# Patient Record
Sex: Male | Born: 1960 | Race: White | Hispanic: No | Marital: Single | Smoking: Former smoker
Health system: Southern US, Community
[De-identification: ages and names within clinical notes are randomized; demographics above are authoritative.]

## PROBLEM LIST (undated history)

## (undated) ENCOUNTER — Emergency Department (HOSPITAL_COMMUNITY): Admission: EM | Payer: Self-pay

## (undated) DIAGNOSIS — F32A Depression, unspecified: Secondary | ICD-10-CM

## (undated) DIAGNOSIS — I10 Essential (primary) hypertension: Secondary | ICD-10-CM

## (undated) DIAGNOSIS — M1990 Unspecified osteoarthritis, unspecified site: Secondary | ICD-10-CM

## (undated) DIAGNOSIS — E7800 Pure hypercholesterolemia, unspecified: Secondary | ICD-10-CM

## (undated) DIAGNOSIS — F329 Major depressive disorder, single episode, unspecified: Secondary | ICD-10-CM

## (undated) HISTORY — DX: Major depressive disorder, single episode, unspecified: F32.9

## (undated) HISTORY — DX: Unspecified osteoarthritis, unspecified site: M19.90

## (undated) HISTORY — DX: Depression, unspecified: F32.A

---

## 2004-08-09 ENCOUNTER — Ambulatory Visit: Payer: Self-pay | Admitting: Internal Medicine

## 2010-10-03 ENCOUNTER — Encounter: Payer: Self-pay | Admitting: Internal Medicine

## 2015-05-27 ENCOUNTER — Ambulatory Visit (INDEPENDENT_AMBULATORY_CARE_PROVIDER_SITE_OTHER): Payer: Worker's Compensation | Admitting: Emergency Medicine

## 2015-05-27 VITALS — BP 138/74 | HR 80 | Temp 98.1°F | Resp 18 | Ht 69.0 in

## 2015-05-27 DIAGNOSIS — Z23 Encounter for immunization: Secondary | ICD-10-CM

## 2015-05-27 DIAGNOSIS — S81811A Laceration without foreign body, right lower leg, initial encounter: Secondary | ICD-10-CM

## 2015-05-27 NOTE — Patient Instructions (Signed)
Please keep dressing on for next 24 hours.  Then remove dressing and clean twice per day with soap and water.   Please return sooner if you are having complications stated below.   Laceration Care, Adult A laceration is a cut or lesion that goes through all layers of the skin and into the tissue just beneath the skin. TREATMENT  Some lacerations may not require closure. Some lacerations may not be able to be closed due to an increased risk of infection. It is important to see your caregiver as soon as possible after an injury to minimize the risk of infection and maximize the opportunity for successful closure. If closure is appropriate, pain medicines may be given, if needed. The wound will be cleaned to help prevent infection. Your caregiver will use stitches (sutures), staples, wound glue (adhesive), or skin adhesive strips to repair the laceration. These tools bring the skin edges together to allow for faster healing and a better cosmetic outcome. However, all wounds will heal with a scar. Once the wound has healed, scarring can be minimized by covering the wound with sunscreen during the day for 1 full year. HOME CARE INSTRUCTIONS  For sutures or staples:  Keep the wound clean and dry.  If you were given a bandage (dressing), you should change it at least once a day. Also, change the dressing if it becomes wet or dirty, or as directed by your caregiver.  Wash the wound with soap and water 2 times a day. Rinse the wound off with water to remove all soap. Pat the wound dry with a clean towel.  After cleaning, apply a thin layer of the antibiotic ointment as recommended by your caregiver. This will help prevent infection and keep the dressing from sticking.  You may shower as usual after the first 24 hours. Do not soak the wound in water until the sutures are removed.  Only take over-the-counter or prescription medicines for pain, discomfort, or fever as directed by your caregiver.  Get your  sutures or staples removed as directed by your caregiver. For skin adhesive strips:  Keep the wound clean and dry.  Do not get the skin adhesive strips wet. You may bathe carefully, using caution to keep the wound dry.  If the wound gets wet, pat it dry with a clean towel.  Skin adhesive strips will fall off on their own. You may trim the strips as the wound heals. Do not remove skin adhesive strips that are still stuck to the wound. They will fall off in time. For wound adhesive:  You may briefly wet your wound in the shower or bath. Do not soak or scrub the wound. Do not swim. Avoid periods of heavy perspiration until the skin adhesive has fallen off on its own. After showering or bathing, gently pat the wound dry with a clean towel.  Do not apply liquid medicine, cream medicine, or ointment medicine to your wound while the skin adhesive is in place. This may loosen the film before your wound is healed.  If a dressing is placed over the wound, be careful not to apply tape directly over the skin adhesive. This may cause the adhesive to be pulled off before the wound is healed.  Avoid prolonged exposure to sunlight or tanning lamps while the skin adhesive is in place. Exposure to ultraviolet light in the first year will darken the scar.  The skin adhesive will usually remain in place for 5 to 10 days, then naturally fall  off the skin. Do not pick at the adhesive film. You may need a tetanus shot if:  You cannot remember when you had your last tetanus shot.  You have never had a tetanus shot. If you get a tetanus shot, your arm may swell, get red, and feel warm to the touch. This is common and not a problem. If you need a tetanus shot and you choose not to have one, there is a rare chance of getting tetanus. Sickness from tetanus can be serious. SEEK MEDICAL CARE IF:   You have redness, swelling, or increasing pain in the wound.  You see a red line that goes away from the wound.  You  have yellowish-white fluid (pus) coming from the wound.  You have a fever.  You notice a bad smell coming from the wound or dressing.  Your wound breaks open before or after sutures have been removed.  You notice something coming out of the wound such as wood or glass.  Your wound is on your hand or foot and you cannot move a finger or toe. SEEK IMMEDIATE MEDICAL CARE IF:   Your pain is not controlled with prescribed medicine.  You have severe swelling around the wound causing pain and numbness or a change in color in your arm, hand, leg, or foot.  Your wound splits open and starts bleeding.  You have worsening numbness, weakness, or loss of function of any joint around or beyond the wound.  You develop painful lumps near the wound or on the skin anywhere on your body. MAKE SURE YOU:   Understand these instructions.  Will watch your condition.  Will get help right away if you are not doing well or get worse. Document Released: 08/29/2005 Document Revised: 11/21/2011 Document Reviewed: 02/22/2011 [facility] Patient Information 2015 [place], [place]. This information is not intended to replace advice given to you by your health care provider. Make sure you discuss any questions you have with your health care provider.

## 2015-05-27 NOTE — Progress Notes (Deleted)
[facility] [address], [place] [address] [phone]  Date:  05/27/2015   Name:  Victor Olson   DOB:  1961/03/24   MRN:  [id number]  PCP:  No PCP Per Patient    History of Present Illness:  Victor Olson is a 54 y.o. male patient who present to [facility]     There are no active problems to display for this patient.   History reviewed. No pertinent past medical history.  History reviewed. No pertinent past surgical history.  Social History  Substance Use Topics  . Smoking status: Current Every Day Smoker  . Smokeless tobacco: None  . Alcohol Use: No    Family History  Problem Relation Age of Onset  . Heart disease Father     No Known Allergies  Medication list has been reviewed and updated.  No current outpatient prescriptions on file prior to visit.   No current facility-administered medications on file prior to visit.    ROS   Physical Examination: BP 138/74 mmHg  Pulse 80  Temp(Src) 98.1 F (36.7 C) (Oral)  Resp 18  Ht  (1.753 m)  SpO2 98% Ideal Body Weight: Weight in (lb) to have BMI = 25: 168.9  Physical Exam   Assessment and Plan:  1. Need for prophylactic vaccination with combined diphtheria-tetanus-pertussis (DTP) vaccine *** - Tdap vaccine greater than or equal to 7yo IM   Trena Platt, PA-C [facility] 05/27/2015 11:42 AM

## 2015-05-29 NOTE — Progress Notes (Signed)
Victor Olson 05-Mar-1961 54 y.o.   Chief Complaint  Patient presents with  . Laceration    Right knee today     Date of Injury: 05/27/2015  History of Present Illness:  Presents for evaluation of work-related complaint.  Victor Olson is a 54 y.o. male patient who presents to [facility] for chief complaint of a right leg laceration.  He was attempting to cut a conduit when the conduit slipped and the cutting knife sliced his right shin just below the knee. He has no numbness or tingling. He is able to apply weight to the leg.  Bleeding immediately ensued. He did not clean the wound but instead packed it with Vaseline. The bleeding has continued.  He needs a TDAP at this time.    ROS ROS unremarkable unless listed above.   No Known Allergies   Current medications reviewed and updated. Past medical history, family history, social history have been reviewed and updated.   Physical Exam  Constitutional: He is well-developed, well-nourished, and in no distress. No distress.  HENT:  Head: Normocephalic and atraumatic.  Musculoskeletal:       Right knee: He exhibits normal range of motion and no swelling.  Skin: Skin is warm and dry. He is not diaphoretic.  Right leg with 3 cm horizontal laceration just inferior to knee. Laceration does not extend through musculature. No foreign bodies seen. Appears to extend through the adipose tissue.   Procedure: Verbal consent obtained.  1% Lidocaine with epi inserted into wound site.  Anesthesia obtained.  Wound cleansed with soap and water.  5-0 ethilon used for horizontal mattress suture.    Assessment and Plan: 54 year old male is here today for chief complaint right leg laceration. Laceration sutured with little complication.  Advised wound care protocol.  Alarming sxs discussed. He will rtc in 9 days for suture removal.   Leg laceration, right, initial encounter - Plan: Tdap vaccine greater than or equal to 7yo IM  Need for prophylactic  vaccination with combined diphtheria-tetanus-pertussis (DTP) vaccine - Plan: Tdap vaccine greater than or equal to 7yo IM  Trena Platt, PA-C [facility] 9/16/20165:12 PM

## 2015-05-30 NOTE — Progress Notes (Signed)
  Medical screening examination/treatment/procedure(s) were performed by non-physician practitioner and as supervising physician I was immediately available for consultation/collaboration.     

## 2016-12-22 ENCOUNTER — Ambulatory Visit (INDEPENDENT_AMBULATORY_CARE_PROVIDER_SITE_OTHER): Payer: Self-pay

## 2016-12-22 ENCOUNTER — Ambulatory Visit (INDEPENDENT_AMBULATORY_CARE_PROVIDER_SITE_OTHER): Payer: Self-pay | Admitting: Family Medicine

## 2016-12-22 VITALS — BP 140/84 | HR 88 | Temp 97.9°F | Resp 18 | Ht 69.0 in | Wt 171.0 lb

## 2016-12-22 DIAGNOSIS — F1123 Opioid dependence with withdrawal: Secondary | ICD-10-CM

## 2016-12-22 DIAGNOSIS — G8929 Other chronic pain: Secondary | ICD-10-CM

## 2016-12-22 DIAGNOSIS — M545 Low back pain, unspecified: Secondary | ICD-10-CM

## 2016-12-22 DIAGNOSIS — F1193 Opioid use, unspecified with withdrawal: Secondary | ICD-10-CM

## 2016-12-22 DIAGNOSIS — R399 Unspecified symptoms and signs involving the genitourinary system: Secondary | ICD-10-CM

## 2016-12-22 LAB — POCT CBC
GRANULOCYTE PERCENT: 64.2 % (ref 37–80)
HEMATOCRIT: 48.4 % (ref 43.5–53.7)
Hemoglobin: 17 g/dL (ref 14.1–18.1)
LYMPH, POC: 3.5 — AB (ref 0.6–3.4)
MCH, POC: 31 pg (ref 27–31.2)
MCHC: 35.2 g/dL (ref 31.8–35.4)
MCV: 88.2 fL (ref 80–97)
MID (cbc): 0.5 (ref 0–0.9)
MPV: 8.7 fL (ref 0–99.8)
POC GRANULOCYTE: 7.3 — AB (ref 2–6.9)
POC LYMPH PERCENT: 31 %L (ref 10–50)
POC MID %: 4.8 %M (ref 0–12)
Platelet Count, POC: 342 10*3/uL (ref 142–424)
RBC: 5.48 M/uL (ref 4.69–6.13)
RDW, POC: 12.8 %
WBC: 11.3 10*3/uL — AB (ref 4.6–10.2)

## 2016-12-22 LAB — GLUCOSE, POCT (MANUAL RESULT ENTRY): POC GLUCOSE: 112 mg/dL — AB (ref 70–99)

## 2016-12-22 LAB — POCT URINALYSIS DIP (MANUAL ENTRY)
Bilirubin, UA: NEGATIVE
Glucose, UA: NEGATIVE mg/dL
Ketones, POC UA: NEGATIVE mg/dL
Leukocytes, UA: NEGATIVE
NITRITE UA: NEGATIVE
PH UA: 6 (ref 5.0–8.0)
PROTEIN UA: NEGATIVE mg/dL
RBC UA: NEGATIVE
SPEC GRAV UA: 1.02 (ref 1.010–1.025)
UROBILINOGEN UA: 0.2 U/dL

## 2016-12-22 MED ORDER — TRAMADOL HCL 50 MG PO TABS: 50.0000 mg | ORAL_TABLET | Freq: Three times a day (TID) | ORAL | 1 refills | Status: AC | PRN

## 2016-12-22 MED ORDER — KETOROLAC TROMETHAMINE 60 MG/2ML IM SOLN
60.0000 mg | Freq: Once | INTRAMUSCULAR | Status: AC
  Administered 2016-12-22: 60 mg via INTRAMUSCULAR

## 2016-12-22 MED ORDER — CYCLOBENZAPRINE HCL 10 MG PO TABS: 10.0000 mg | ORAL_TABLET | Freq: Every evening | ORAL | 1 refills | Status: DC | PRN

## 2016-12-22 NOTE — Progress Notes (Signed)
Victor Olson is a 56 y.o. male who presents to Primary Care at [facility] today for back pain:    1.  Back pain:  Present for the past month. Describes dull aching pain alternating with sharp stabbing pain that radiates from his neck, mid back, lumbar region. Does not radiate to buttocks. Did not radiate to legs. The specimen bases and some paresthesias described as burning sensation bilateral bottoms of his feet. Otherwise no paresthesias, numbness, weakness bilateral lower extremities. It is not worse in the right versus left side. Pain is 5 out of 10 at its worst is 10. He is not tried any ice or heat for relief. He has been trying this could just rest.  He works as an Personnel officer. It is worse but is trying to bend over because of work. No injuries to his back. He states he was told he was 56 years old 55" worst back arthritis of any 59 year old" the doctor did seem. He took some of his friends "pain pills" starting about 4 weeks ago and took this for about 2 weeks and then friend would not give him anymore.  No LE weakness or changes in gait.  No motor weakness/decreased sensation BL LE's.  No fevers or chills.  No dysuria, hematuria, urinary frequency, incontinence of bladder or bowel.    He has had about 10 years of increasing lower urinary tract symptoms which includes urgency, hesitancy. For the past 2 weeks has been having nocturia 3-4 times a night.  He's also had several episodes of diarrhea the past 2 weeks. With some "upset stomach." No vomiting. No nausea. Diarrhea has been watery. Nonbloody. No melena.   ROS as above.     PMH reviewed. Patient is a nonsmoker.   Past Medical History:  Diagnosis Date  . Arthritis   . Depression    No past surgical history on file.  Medications reviewed. Current Outpatient Prescriptions  Medication Sig Dispense Refill  . Oxycodone-Acetaminophen (PERCOCET PO) Take by mouth.     No current facility-administered medications for this visit.       Physical Exam:  BP (!) 160/77   Pulse 88   Temp 97.9 F (36.6 C) (Oral)   Resp 18   Ht  (1.753 m)   Wt 171 lb (77.6 kg)   SpO2 97%   BMI 25.25 kg/m  Gen:  Alert, cooperative patient who appears stated age in no acute distress.  Vital signs reviewed. HEENT: EOMI,  MMM Pulm:  Clear to auscultation bilaterally with good air movement.  No wheezes or rales noted.  Cardiac:  Regular rate and rhythm without murmur auscultated.  Good S1/S2. Abd:  Soft/nondistended/nontender.   Ext:  No LE edema noted Back:  Normal skin, Spine with normal alignment and no deformity.  No tenderness to vertebral process palpation.  Paraspinous muscles are tender and with spasm most notably mid-thoracic and lower lumbar spine.   Range of motion is full at neck and decreased flexion in lumbar sacral regions.  Straight leg raise is positive BL Neuro:  Sensation and motor function 5/5 bilateral lower extremities.  Patellar and Achilles  DTR's +2 patellar BL.  He is able to walk on his heels and toes without difficulty.  Psych:  Appears mildly anxious.  Skin:  Nontender nodules, rubber, consistent with lipoma tricep region of Left arm and 1 on chest.   Lymph:  No axillary lymph nodules.   Assessment and Plan:  1.  Muscle spasm: - secondary to  work and ongoing aggravation.   - Negative radiographs here.   - Treating with ibuprofen, tramadol severe pain, muscle relaxer - ice and home physical therapy  2.  Opiate withdrawal: - patient's sweatiness, diarrhea, other symptoms most consistent with opiate w/d -- which makes sense in light of his friend providing him with inconsistent opiates.  - checking urine test for drugs today.  - no evidence of other systemic issues.  We are checking blood count, etc today  3.  Urinary urgency: - likely BPH symptoms - checking PSA today.

## 2016-12-22 NOTE — Patient Instructions (Addendum)
We checked multiple things today.  Your x-rays showed you do have some arthritis but it's not terrible.    You did not have any signs of diabetes which is good news.  Your urine test was also negative.  This is also good news.   We are checking some blood levels which includes your prostate.  This may simply be enlarged, which can cause many of the symptoms you've been having.   Finally, you are having some withdrawal symptoms from the Percocet.  This will be gone in the next several days.    You have a bad muscle spasm in your back.  Take the Ibuprofen 400 mg every 8 hours or so for pain relief and anti-inflammatory effects.  Take Tramadol for severe pain.  Take the Flexeril as a muscle relaxer at night. This may make you drowsy and if it does do not take it during the day.  Heat and massage are also great to help relieve the pain.   IF you received an x-ray today, you will receive an invoice from [facility]. Please contact [facility] at [phone] with questions or concerns regarding your invoice.   IF you received labwork today, you will receive an invoice from [place]. Please contact [facility] at [phone] with questions or concerns regarding your invoice.   Our billing staff will not be able to assist you with questions regarding bills from these companies.  You will be contacted with the lab results as soon as they are available. The fastest way to get your results is to activate your My Chart account. Instructions are located on the last page of this paperwork. If you have not heard from [place] regarding the results in 2 weeks, please contact this office.

## 2016-12-23 LAB — COMPREHENSIVE METABOLIC PANEL
A/G RATIO: 1.4 (ref 1.2–2.2)
ALT: 15 IU/L (ref 0–44)
AST: 14 IU/L (ref 0–40)
Albumin: 4.6 g/dL (ref 3.5–5.5)
Alkaline Phosphatase: 58 IU/L (ref 39–117)
BILIRUBIN TOTAL: 0.3 mg/dL (ref 0.0–1.2)
BUN/Creatinine Ratio: 11 (ref 9–20)
BUN: 13 mg/dL (ref 6–24)
CALCIUM: 10.2 mg/dL (ref 8.7–10.2)
CHLORIDE: 98 mmol/L (ref 96–106)
CO2: 23 mmol/L (ref 18–29)
Creatinine, Ser: 1.17 mg/dL (ref 0.76–1.27)
GFR calc non Af Amer: 71 mL/min/{1.73_m2} (ref >59)
GFR, EST AFRICAN AMERICAN: 82 mL/min/{1.73_m2} (ref >59)
GLUCOSE: 119 mg/dL — AB (ref 65–99)
Globulin, Total: 3.4 g/dL (ref 1.5–4.5)
POTASSIUM: 4.3 mmol/L (ref 3.5–5.2)
Sodium: 142 mmol/L (ref 134–144)
TOTAL PROTEIN: 8 g/dL (ref 6.0–8.5)

## 2016-12-23 LAB — PMP SCREEN PROFILE (10S), URINE
AMPHETAMINE SCRN UR: NEGATIVE ng/mL
Barbiturate Screen, Ur: NEGATIVE ng/mL
Benzodiazepine Screen, Urine: NEGATIVE ng/mL
COCAINE(METAB.) SCREEN, URINE: NEGATIVE ng/mL
CREATININE(CRT), U: 135.5 mg/dL (ref 20.0–300.0)
Cannabinoids Ur Ql Scn: NEGATIVE ng/mL
METHADONE SCREEN, URINE: NEGATIVE ng/mL
OPIATE SCRN UR: NEGATIVE ng/mL
Oxycodone+Oxymorphone Ur Ql Scn: NEGATIVE ng/mL
PCP SCRN UR: NEGATIVE ng/mL
PROPOXYPHENE SCREEN: NEGATIVE ng/mL
Ph of Urine: 5.6 (ref 4.5–8.9)

## 2016-12-23 LAB — PSA: PROSTATE SPECIFIC AG, SERUM: 2.6 ng/mL (ref 0.0–4.0)

## 2016-12-23 LAB — TSH: TSH: 1.58 u[IU]/mL (ref 0.450–4.500)

## 2017-01-16 ENCOUNTER — Telehealth: Payer: Self-pay

## 2017-01-16 NOTE — Telephone Encounter (Signed)
Looks like written rx given for tramadol?  See labs and advise

## 2017-01-16 NOTE — Telephone Encounter (Signed)
PATIENT SAW DR. Gwendolyn GrantWALDEN ON April 12,2018 FOR A FEW DIFFERENT THINGS. HE SAID DR. Gwendolyn GrantWALDEN DID SOME LAB WORK ON HIM BUT HE HAS NEVER GOTTEN THE RESULTS. HE ALSO SAID DR. Gwendolyn GrantWALDEN WAS GOING TO CALL IN SOME MEDICINE FOR HIS FOOT PAIN AND HE HAS NEVER GOTTEN THAT EITHER. BEST PHONE [phone] (CELL) PHARMACY CHOICE IS [facility] ON [address]. MBC

## 2017-01-17 NOTE — Telephone Encounter (Signed)
Labs all looked good.  The patient had a refill for his Tramadol on the printed prescription he received last OV.  I checked the [place] database, and he has not refilled this, so he should be able to go back to his pharmacy to get his medicines refilled.    Please call and let patient know about his normal labs and the fact he has a refill on his Tramadol.  This should help with his foot pain -- if not, he should make an appt to be seen again.  Thanks!  JW

## 2017-01-17 NOTE — Telephone Encounter (Signed)
Pt states he is using tramadol? but it doesn't help, now is asking for stronger pain med. He was advised of nl labs

## 2017-01-19 NOTE — Telephone Encounter (Signed)
If he needs a medicine stronger than Tramadol, he will need to be seen for this.  In the meantime, he should continue with the tramadol for pain relief.

## 2017-01-20 NOTE — Telephone Encounter (Signed)
Pt advised, tx up front for appt. 

## 2017-01-25 ENCOUNTER — Ambulatory Visit (INDEPENDENT_AMBULATORY_CARE_PROVIDER_SITE_OTHER): Payer: Self-pay | Admitting: Family Medicine

## 2017-01-25 ENCOUNTER — Encounter: Payer: Self-pay | Admitting: Family Medicine

## 2017-01-25 VITALS — BP 146/90 | HR 120 | Temp 98.3°F | Resp 17 | Ht 69.0 in | Wt 175.0 lb

## 2017-01-25 DIAGNOSIS — M5442 Lumbago with sciatica, left side: Secondary | ICD-10-CM

## 2017-01-25 MED ORDER — GABAPENTIN 300 MG PO CAPS: 300.0000 mg | ORAL_CAPSULE | Freq: Every day | ORAL | 3 refills | Status: AC

## 2017-01-25 MED ORDER — HYDROCODONE-ACETAMINOPHEN 5-325 MG PO TABS: 1.0000 | ORAL_TABLET | Freq: Four times a day (QID) | ORAL | 0 refills | Status: DC | PRN

## 2017-01-25 MED ORDER — CYCLOBENZAPRINE HCL 10 MG PO TABS: 10.0000 mg | ORAL_TABLET | Freq: Every evening | ORAL | 1 refills | Status: AC | PRN

## 2017-01-25 MED ORDER — HYDROCODONE-ACETAMINOPHEN 10-325 MG PO TABS: 1.0000 | ORAL_TABLET | ORAL | 0 refills | Status: DC | PRN
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
## 2017-01-25 NOTE — Patient Instructions (Addendum)
  It was good to see you today.    I'm sorry you're hurting so bad!  Take the Norco when you're having bad pain.  Take the Gabapentin at night to help with the tingling and burning.  You can take 1/2 to 1 pill of Flexeril as needed for a muscle relaxer every 8 hours or so.  We'll get you set up for an MRI of your back.    If you start having worsening pain despite the medicine, any weakness, or any incontinence, don't wait and come back immediately.    Call [phone] for [facility] Financial Assistance.     IF you received an x-ray today, you will receive an invoice from [facility]. Please contact [facility] at [phone] with questions or concerns regarding your invoice.   IF you received labwork today, you will receive an invoice from [facility]. Please contact [facility] at [phone] with questions or concerns regarding your invoice.   Our billing staff will not be able to assist you with questions regarding bills from these companies.  You will be contacted with the lab results as soon as they are available. The fastest way to get your results is to activate your My Chart account. Instructions are located on the last page of this paperwork. If you have not heard from us regarding the results in 2 weeks, please contact this office.

## 2017-02-08 ENCOUNTER — Telehealth: Payer: Self-pay | Admitting: Family Medicine

## 2017-02-08 DIAGNOSIS — G8929 Other chronic pain: Secondary | ICD-10-CM

## 2017-02-08 DIAGNOSIS — M545 Low back pain: Principal | ICD-10-CM

## 2017-02-08 NOTE — Telephone Encounter (Signed)
This has become a chronic issue.  Will need to refer her to pain management. I have put this referral in today.

## 2017-02-08 NOTE — Telephone Encounter (Signed)
Pt called returning a phone call.. Pt also stated that he needs a refill on his pain killers HYDROcodone-acetaminophen (NORCO) 5-325 MG tablet pt stated that he needs something stronger that these are not working for him  Please advise: [phone]

## 2017-02-08 NOTE — Telephone Encounter (Signed)
I dont see we called him?  Will you give different pain med?

## 2017-02-11 NOTE — Telephone Encounter (Signed)
Let pt know he will be referred to pain management

## 2017-02-11 NOTE — Telephone Encounter (Signed)
He called back and gloria read him the message

## 2017-02-11 NOTE — Telephone Encounter (Signed)
Mailbox full and can't accept messages

## 2017-11-03 ENCOUNTER — Other Ambulatory Visit: Payer: Self-pay

## 2017-11-03 ENCOUNTER — Encounter (HOSPITAL_COMMUNITY): Payer: Self-pay

## 2017-11-03 DIAGNOSIS — N410 Acute prostatitis: Secondary | ICD-10-CM | POA: Insufficient documentation

## 2017-11-03 DIAGNOSIS — Z79899 Other long term (current) drug therapy: Secondary | ICD-10-CM | POA: Insufficient documentation

## 2017-11-03 DIAGNOSIS — Z87891 Personal history of nicotine dependence: Secondary | ICD-10-CM | POA: Insufficient documentation

## 2017-11-03 NOTE — ED Triage Notes (Signed)
Pt presents to the ed with complaints of pain in his rectum x 2 nights. Denies any blood in his stool. Diarrhea x 6 months. NAD in triage.

## 2017-11-04 ENCOUNTER — Emergency Department (HOSPITAL_COMMUNITY)
Admission: EM | Admit: 2017-11-04 | Discharge: 2017-11-04 | Disposition: A | Discharge status: Home / Self Care | Payer: Self-pay | Attending: Emergency Medicine | Admitting: Emergency Medicine

## 2017-11-04 ENCOUNTER — Encounter (HOSPITAL_COMMUNITY): Payer: Self-pay | Admitting: *Deleted

## 2017-11-04 ENCOUNTER — Emergency Department (HOSPITAL_COMMUNITY): Payer: Self-pay

## 2017-11-04 DIAGNOSIS — N410 Acute prostatitis: Secondary | ICD-10-CM

## 2017-11-04 DIAGNOSIS — K6289 Other specified diseases of anus and rectum: Secondary | ICD-10-CM

## 2017-11-04 LAB — CBC WITH DIFFERENTIAL/PLATELET
BASOS PCT: 0 %
Basophils Absolute: 0 10*3/uL (ref 0.0–0.1)
EOS PCT: 1 %
Eosinophils Absolute: 0.2 10*3/uL (ref 0.0–0.7)
HEMATOCRIT: 52.4 % — AB (ref 39.0–52.0)
HEMOGLOBIN: 18.5 g/dL — AB (ref 13.0–17.0)
LYMPHS PCT: 36 %
Lymphs Abs: 5.5 10*3/uL — ABNORMAL HIGH (ref 0.7–4.0)
MCH: 30.7 pg (ref 26.0–34.0)
MCHC: 35.3 g/dL (ref 30.0–36.0)
MCV: 87 fL (ref 78.0–100.0)
MONO ABS: 1.4 10*3/uL — AB (ref 0.1–1.0)
MONOS PCT: 9 %
NEUTROS PCT: 54 %
Neutro Abs: 8.2 10*3/uL — ABNORMAL HIGH (ref 1.7–7.7)
Platelets: 295 10*3/uL (ref 150–400)
RBC: 6.02 MIL/uL — AB (ref 4.22–5.81)
RDW: 14 % (ref 11.5–15.5)
SMEAR REVIEW: ADEQUATE
WBC: 15.3 10*3/uL — AB (ref 4.0–10.5)

## 2017-11-04 LAB — BASIC METABOLIC PANEL
ANION GAP: 13 (ref 5–15)
BUN: 12 mg/dL (ref 6–20)
CHLORIDE: 101 mmol/L (ref 101–111)
CO2: 20 mmol/L — AB (ref 22–32)
Calcium: 9.8 mg/dL (ref 8.9–10.3)
Creatinine, Ser: 1.89 mg/dL — ABNORMAL HIGH (ref 0.61–1.24)
GFR calc non Af Amer: 39 mL/min — ABNORMAL LOW (ref >60)
GFR, EST AFRICAN AMERICAN: 45 mL/min — AB (ref >60)
Glucose, Bld: 96 mg/dL (ref 65–99)
Potassium: 5.7 mmol/L — ABNORMAL HIGH (ref 3.5–5.1)
SODIUM: 134 mmol/L — AB (ref 135–145)

## 2017-11-04 MED ORDER — IOPAMIDOL (ISOVUE-300) INJECTION 61%
INTRAVENOUS | Status: AC
  Administered 2017-11-04: 80 mL
  Filled 2017-11-04: qty 100

## 2017-11-04 MED ORDER — CIPROFLOXACIN HCL 500 MG PO TABS: 500.0000 mg | ORAL_TABLET | Freq: Two times a day (BID) | ORAL | 0 refills | Status: AC

## 2017-11-04 MED ORDER — MORPHINE SULFATE (PF) 4 MG/ML IV SOLN
4.0000 mg | Freq: Once | INTRAVENOUS | Status: AC
  Administered 2017-11-04: 4 mg via INTRAVENOUS
  Filled 2017-11-04: qty 1

## 2017-11-04 MED ORDER — HYDROCODONE-ACETAMINOPHEN 5-325 MG PO TABS
1.0000 | ORAL_TABLET | Freq: Four times a day (QID) | ORAL | 0 refills | Status: AC | PRN
Start: 2017-11-04 — End: ?

## 2017-11-04 NOTE — ED Provider Notes (Signed)
[facility] EMERGENCY DEPARTMENT Provider Note   CSN: [id number] Arrival date & time: 11/03/17  1646     History   Chief Complaint Chief Complaint  Patient presents with  . Rectal Pain    HPI Victor Olson is a 57 y.o. male.  Patient is a 57 year old male with history of arthritis and depression.  He presents today for evaluation of rectal pain.  He reports discomfort in his rectum intermittently for the past 6 months, however this is substantially worsened over the past 2 days.  He denies any discomfort with defecation.  He denies any black or bloody stools.  He denies any fevers or chills.   The history is provided by the patient.    Past Medical History:  Diagnosis Date  . Arthritis   . Depression     There are no active problems to display for this patient.   History reviewed. No pertinent surgical history.     Home Medications    Prior to Admission medications   Medication Sig Start Date End Date Taking? Authorizing Provider  cyclobenzaprine (FLEXERIL) 10 MG tablet Take 1 tablet (10 mg total) by mouth at bedtime as needed for muscle spasms. Patient not taking: Reported on 11/04/2017 01/25/17   Tobey Grim, MD  gabapentin (NEURONTIN) 300 MG capsule Take 1 capsule (300 mg total) by mouth at bedtime. Patient not taking: Reported on 11/04/2017 01/25/17   Tobey Grim, MD  HYDROcodone-acetaminophen [facility]) 5-325 MG tablet Take 1 tablet by mouth every 6 (six) hours as needed for moderate pain. Patient not taking: Reported on 11/04/2017 01/25/17   Tobey Grim, MD  traMADol (ULTRAM) 50 MG tablet Take 1 tablet (50 mg total) by mouth every 8 (eight) hours as needed. Patient not taking: Reported on 11/04/2017 12/22/16   Tobey Grim, MD    Family History Family History  Problem Relation Age of Onset  . Heart disease Father     Social History Social History   Tobacco Use  . Smoking status: Former Games developer  . Smokeless tobacco:  Never Used  Substance Use Topics  . Alcohol use: No    Alcohol/week: 0.0 oz  . Drug use: No     Allergies   Patient has no known allergies.   Review of Systems Review of Systems  All other systems reviewed and are negative.    Physical Exam Updated Vital Signs BP 117/79   Pulse 73   Temp 98 F (36.7 C) (Oral)   Resp 17   Wt 79.4 kg (175 lb)   SpO2 96%   BMI 25.84 kg/m   Physical Exam  Constitutional: He is oriented to person, place, and time. He appears well-developed and well-nourished. No distress.  HENT:  Head: Normocephalic and atraumatic.  Mouth/Throat: Oropharynx is clear and moist.  Neck: Normal range of motion. Neck supple.  Cardiovascular: Normal rate and regular rhythm. Exam reveals no friction rub.  No murmur heard. Pulmonary/Chest: Effort normal and breath sounds normal. No respiratory distress. He has no wheezes. He has no rales.  Abdominal: Soft. Bowel sounds are normal. He exhibits no distension. There is no tenderness.  Genitourinary: Rectum normal and prostate normal.  Genitourinary Comments: There are several small hemorrhoids noted.  There are no obvious fissures.  There are no masses within the rectal vault.  He does have some tenderness with palpation of the prostate.  Musculoskeletal: Normal range of motion. He exhibits no edema.  Neurological: He is alert and oriented  to person, place, and time. Coordination normal.  Skin: Skin is warm and dry. He is not diaphoretic.  Nursing note and vitals reviewed.    ED Treatments / Results  Labs (all labs ordered are listed, but only abnormal results are displayed) Labs Reviewed  BASIC METABOLIC PANEL - Abnormal; Notable for the following components:      Result Value   Sodium 134 (*)    Potassium 5.7 (*)    CO2 20 (*)    Creatinine, Ser 1.89 (*)    GFR calc non Af Amer 39 (*)    GFR calc Af Amer 45 (*)    All other components within normal limits  CBC WITH DIFFERENTIAL/PLATELET - Abnormal;  Notable for the following components:   WBC 15.3 (*)    RBC 6.02 (*)    Hemoglobin 18.5 (*)    HCT 52.4 (*)    Neutro Abs 8.2 (*)    Lymphs Abs 5.5 (*)    Monocytes Absolute 1.4 (*)    All other components within normal limits    EKG  EKG Interpretation None       Radiology No results found.  Procedures Procedures (including critical care time)  Medications Ordered in ED Medications  iopamidol (ISOVUE-300) 61 % injection (not administered)  morphine 4 MG/ML injection 4 mg (4 mg Intravenous Given 11/04/17 0242)  morphine 4 MG/ML injection 4 mg (4 mg Intravenous Given 11/04/17 0357)     Initial Impression / Assessment and Plan / ED Course  I have reviewed the triage vital signs and the nursing notes.  Pertinent labs & imaging results that were available during my care of the patient were reviewed by me and considered in my medical decision making (see chart for details).  Patient presents here with complaints of worsening rectal pain over the past several months.  Digital rectal exam reveals no obvious abnormality, however there is some tenderness of the prostate.  CT scan shows a mildly enlarged prostate, however no other obvious abnormality.  There is no perirectal or rectal abscess.  He appears more comfortable after medications given in the ER.  He will be discharged with Cipro for presumed prostatitis, pain medicine, and is to follow-up as needed.  Final Clinical Impressions(s) / ED Diagnoses   Final diagnoses:  None    ED Discharge Orders    None       Geoffery Lyonselo, Isadore Bokhari, MD 11/04/17 [phone]

## 2017-11-04 NOTE — ED Notes (Signed)
Taken to CT at this time. 

## 2017-11-04 NOTE — ED Notes (Signed)
Pt remains in waiting room. Updated on wait for treatment room. 

## 2017-11-04 NOTE — Discharge Instructions (Signed)
Cipro as prescribed.  Hydrocodone is prescribed as needed for pain.  Return to the emergency department if you develop worsening pain, rectal bleeding, high fevers, or other new and concerning symptoms.  Follow-up with your primary doctor in the next 1-2 weeks.

## 2018-05-24 IMAGING — DX DG THORACIC SPINE 2V
2 series · 2 of 2 positions shown · non-contrast
Comparison: None.

CLINICAL DATA: Chronic midline low back pain.

EXAM:
THORACIC SPINE 2 VIEWS

[t-spine ap]
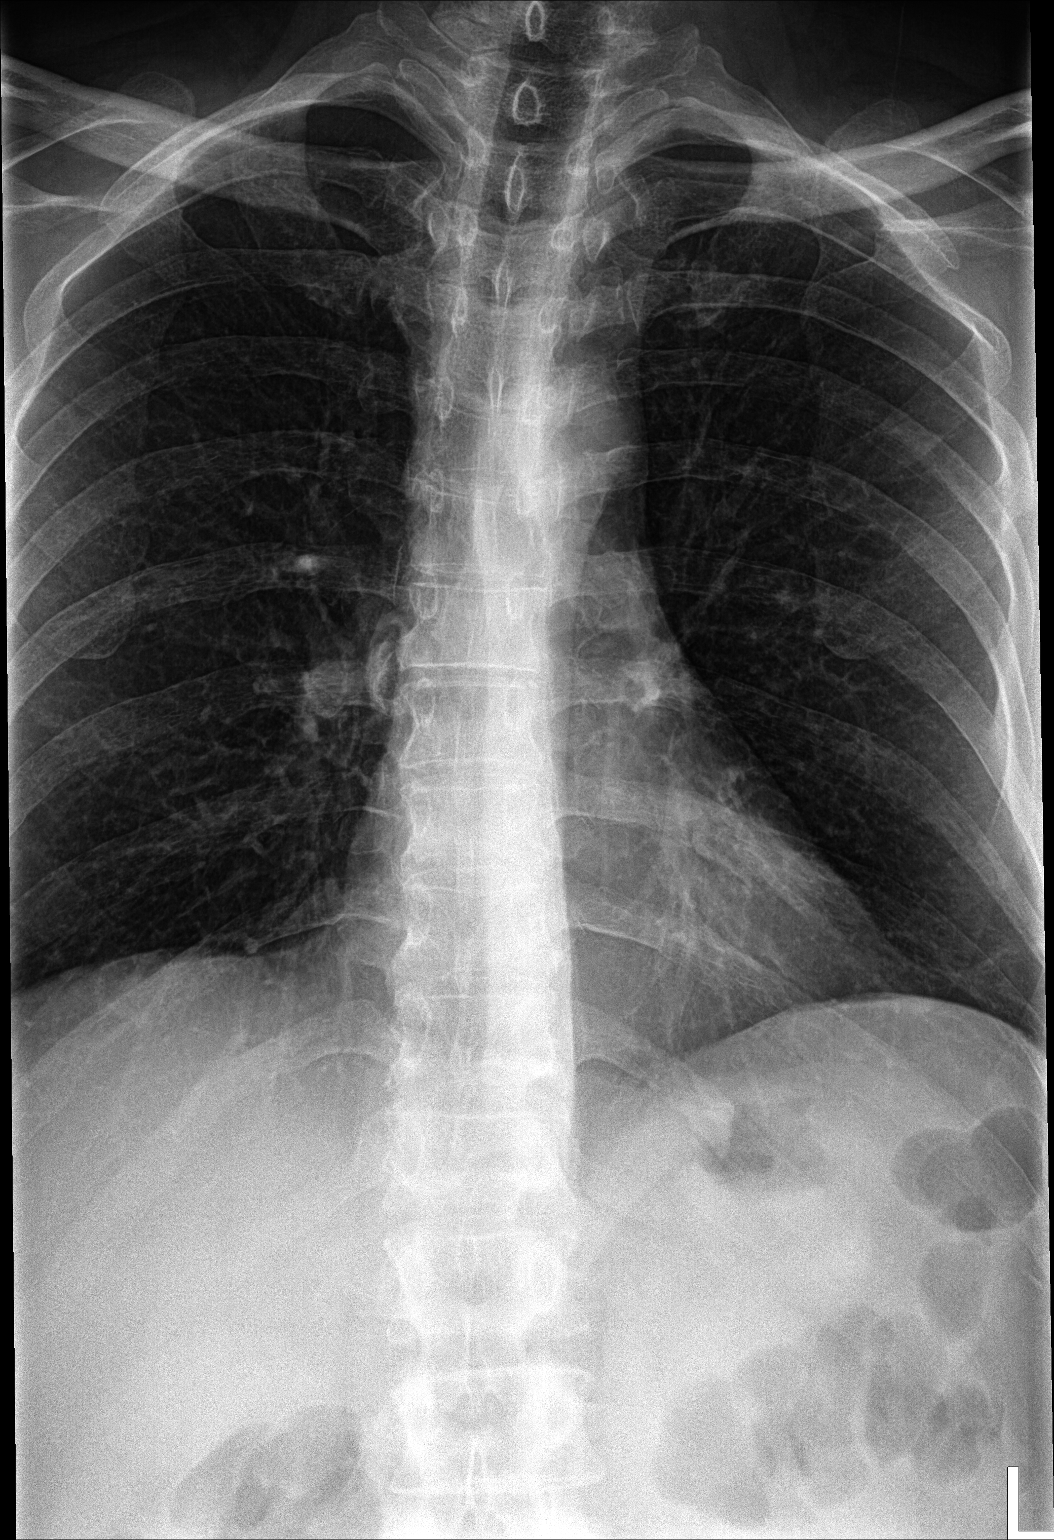

[t-spine lat]
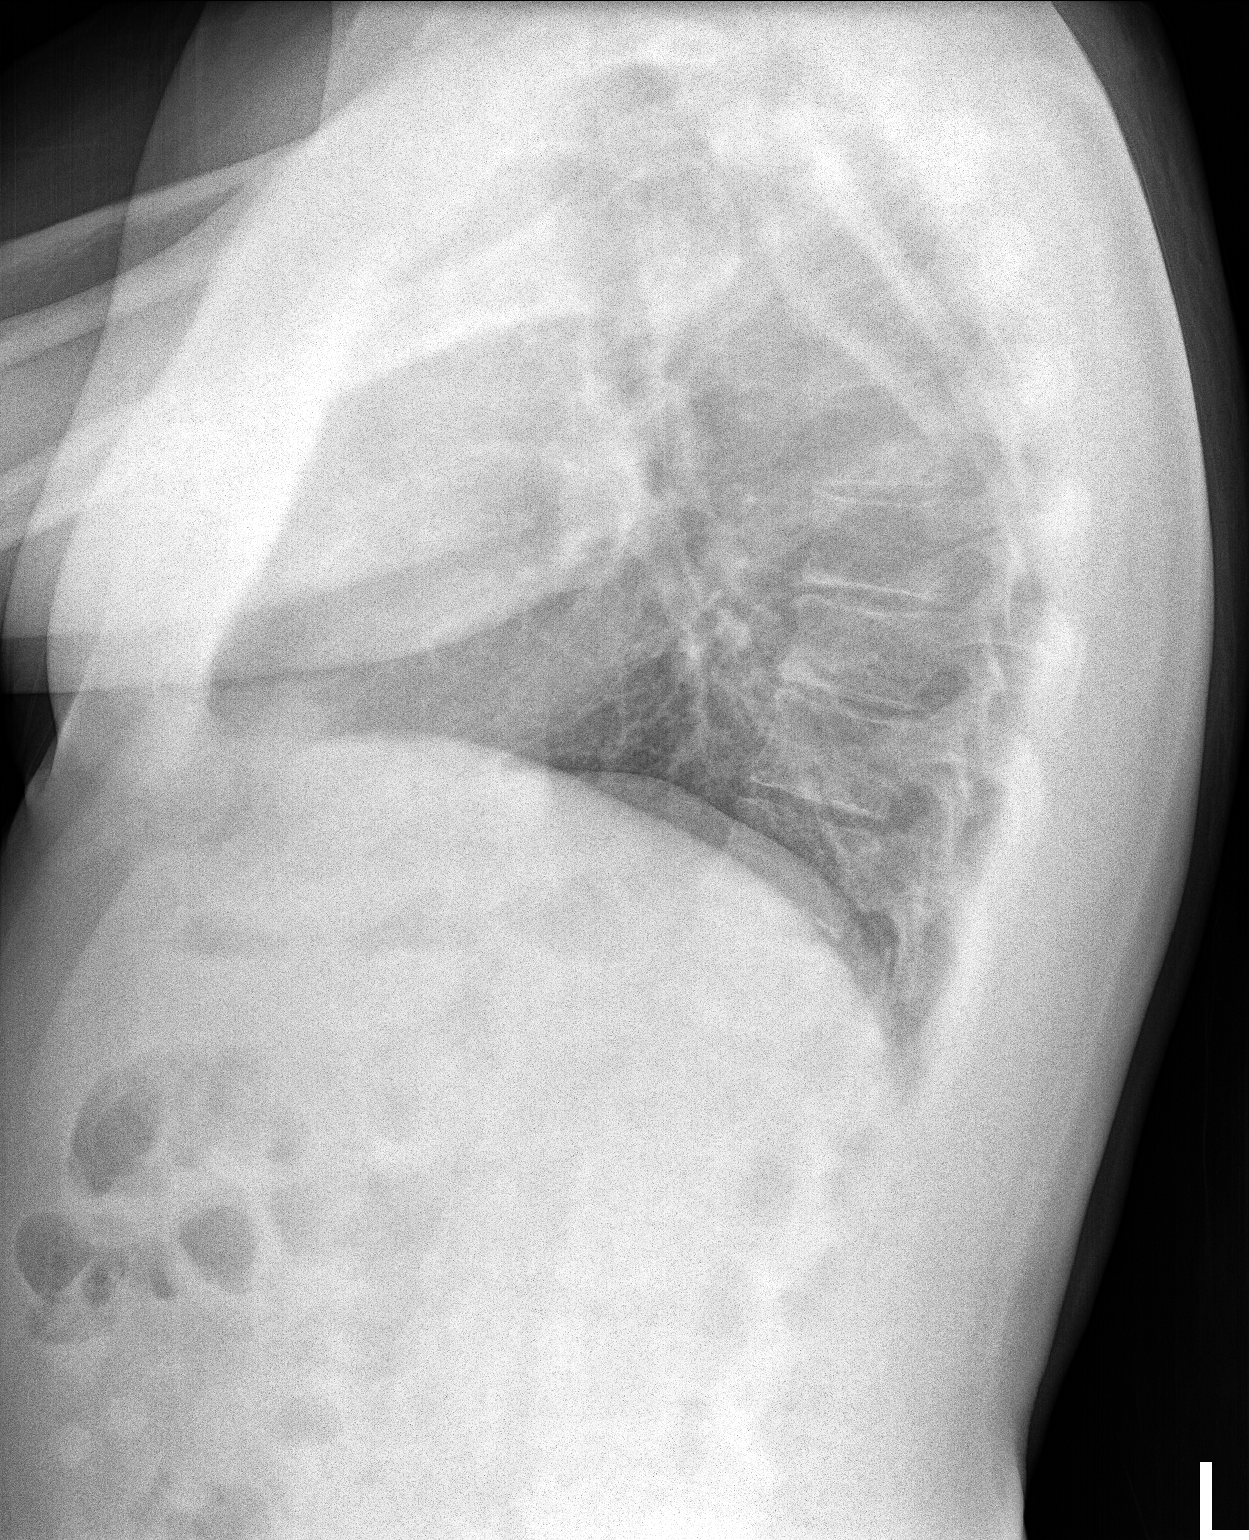

[2 of 2 positions shown; findings below may reference images not displayed]

FINDINGS: There is no evidence of thoracic spine fracture. Alignment is
normal. No other significant bone abnormalities are identified.
IMPRESSION: Negative.

## 2018-05-24 IMAGING — DX DG LUMBAR SPINE COMPLETE 4+V
5 series · 5 of 5 positions shown · non-contrast
Comparison: None.

CLINICAL DATA: Chronic midline low back pain without sciatica

EXAM:
LUMBAR SPINE - COMPLETE 4+ VIEW

[l-spine ap]
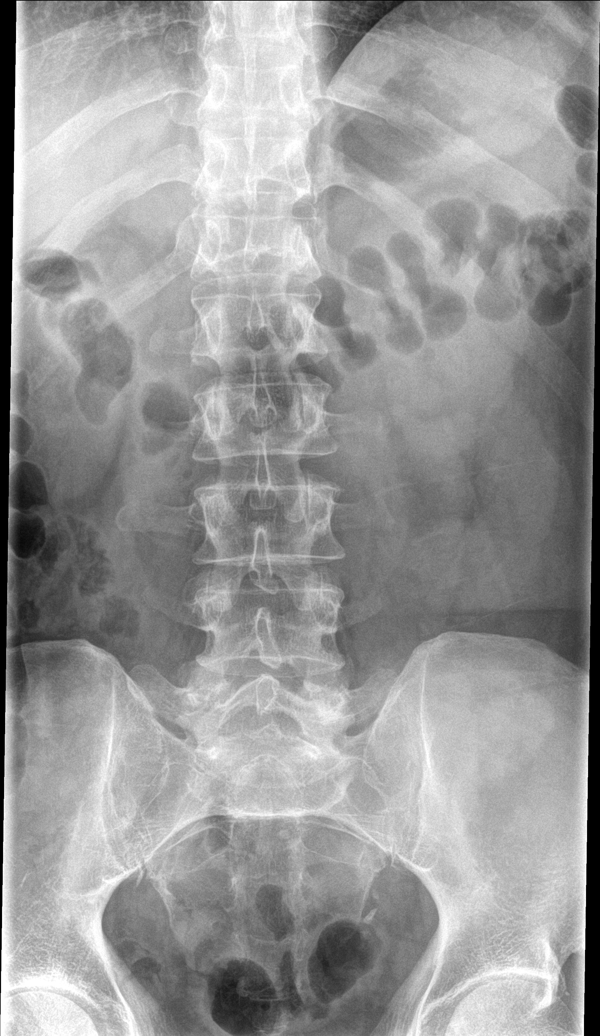

[l-spine obl (1 of 2)]
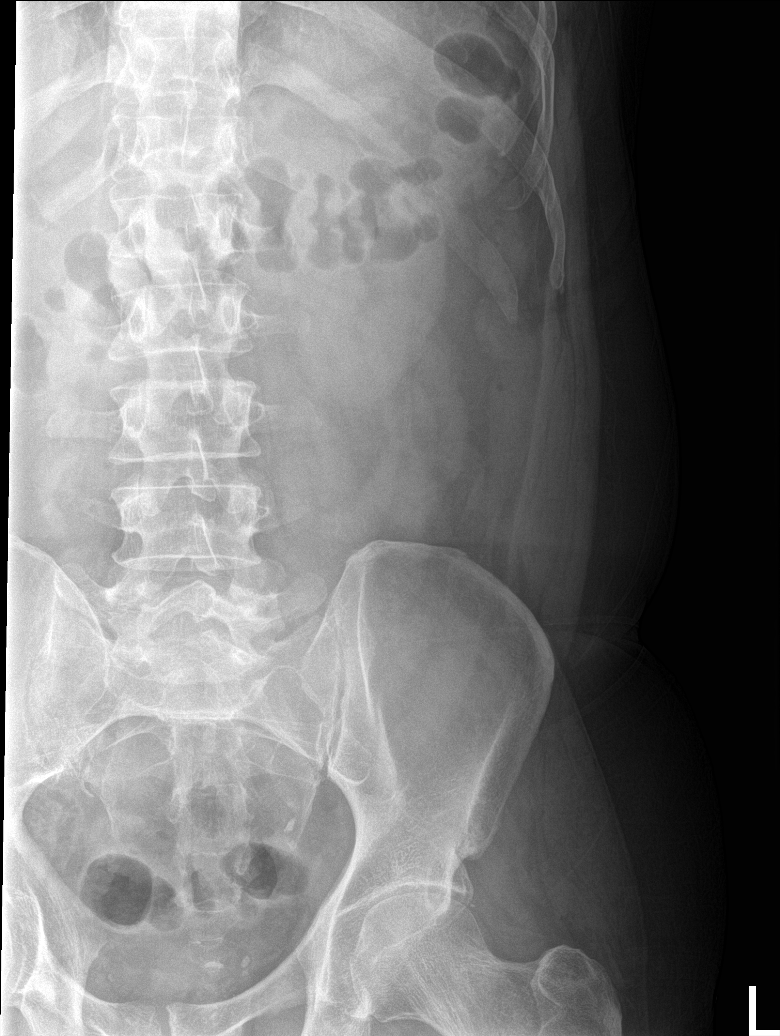

[l-spine obl (2 of 2)]
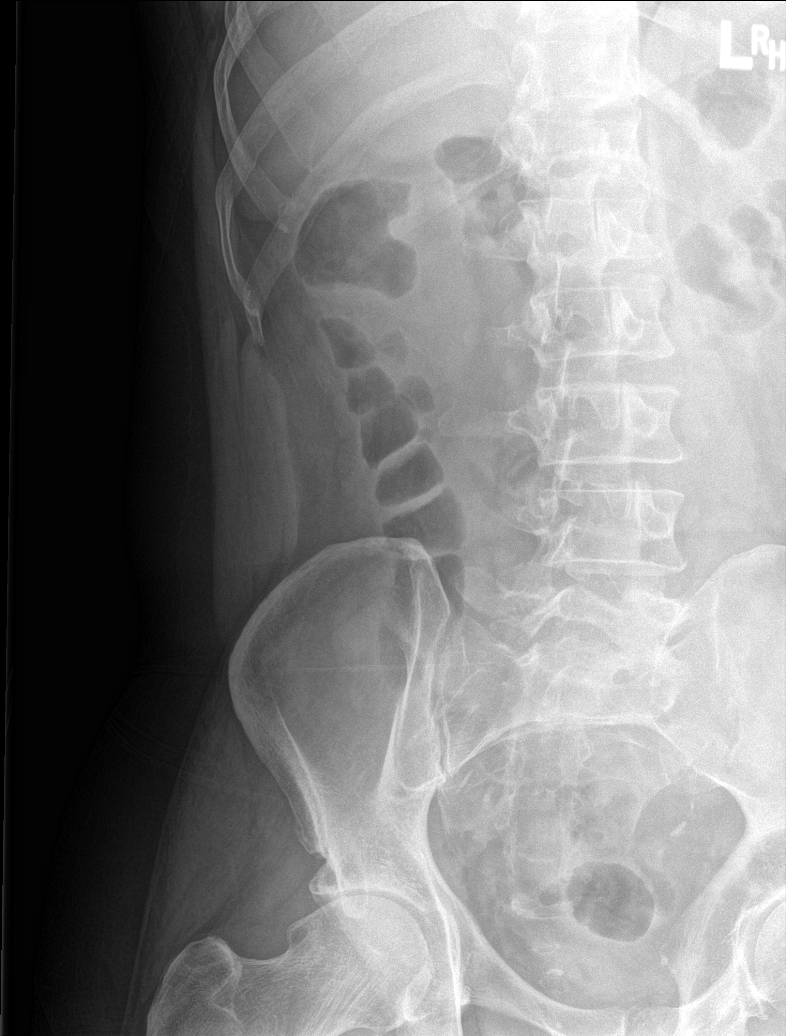

[l-spine lat]
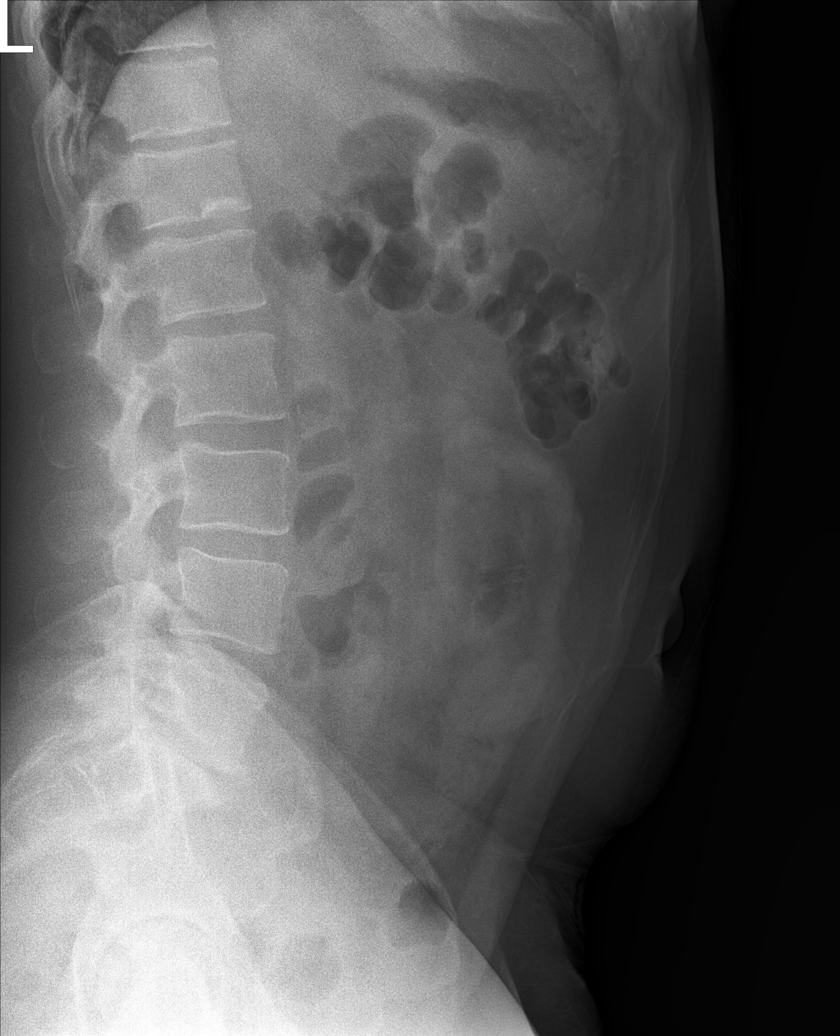

[l-spine l5-s1]
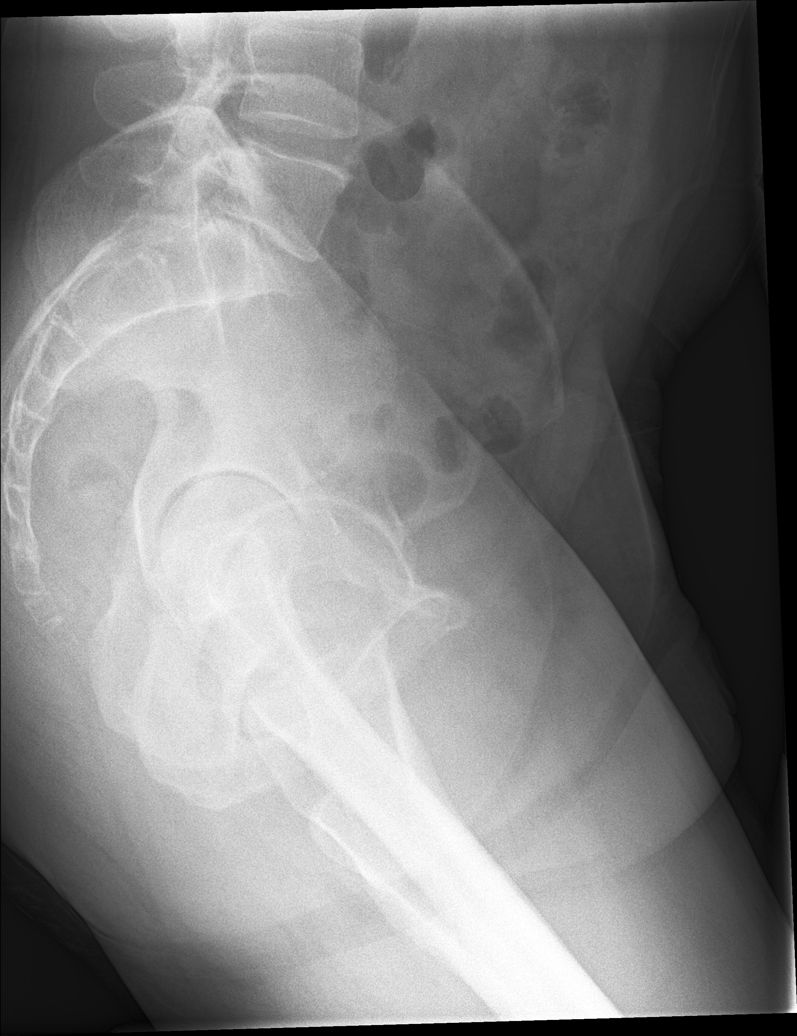

[5 of 5 positions shown; findings below may reference images not displayed]

FINDINGS: Disc space narrowing and early spurring at L5-S1. Normal alignment.
No fracture. SI joints are symmetric and unremarkable.
IMPRESSION: Mild degenerative disc disease changes at L5-S1.  No acute findings.

## 2023-06-06 ENCOUNTER — Other Ambulatory Visit: Payer: Self-pay

## 2023-06-06 ENCOUNTER — Encounter (HOSPITAL_COMMUNITY): Payer: Self-pay | Admitting: Emergency Medicine

## 2023-06-06 ENCOUNTER — Emergency Department (HOSPITAL_COMMUNITY)
Admission: EM | Admit: 2023-06-06 | Discharge: 2023-06-06 | Disposition: A | Discharge status: Home / Self Care | Payer: 59 | Attending: Emergency Medicine | Admitting: Emergency Medicine

## 2023-06-06 ENCOUNTER — Emergency Department (HOSPITAL_COMMUNITY): Payer: 59

## 2023-06-06 DIAGNOSIS — R0602 Shortness of breath: Secondary | ICD-10-CM | POA: Diagnosis not present

## 2023-06-06 DIAGNOSIS — R0789 Other chest pain: Secondary | ICD-10-CM | POA: Diagnosis present

## 2023-06-06 HISTORY — DX: Pure hypercholesterolemia, unspecified: E78.00

## 2023-06-06 HISTORY — DX: Essential (primary) hypertension: I10

## 2023-06-06 LAB — BASIC METABOLIC PANEL
Anion gap: 9 (ref 5–15)
BUN: 8 mg/dL (ref 6–20)
CO2: 23 mmol/L (ref 22–32)
Calcium: 8.9 mg/dL (ref 8.9–10.3)
Chloride: 101 mmol/L (ref 98–111)
Creatinine, Ser: 0.83 mg/dL (ref 0.61–1.24)
GFR, Estimated: >60 mL/min (ref >60)
Glucose, Bld: 147 mg/dL — ABNORMAL HIGH (ref 70–99)
Potassium: 3.5 mmol/L (ref 3.5–5.1)
Sodium: 133 mmol/L — ABNORMAL LOW (ref 135–145)

## 2023-06-06 LAB — CBC
HCT: 47.5 % (ref 39.0–52.0)
Hemoglobin: 16.1 g/dL (ref 13.0–17.0)
MCH: 31.4 pg (ref 26.0–34.0)
MCHC: 33.9 g/dL (ref 30.0–36.0)
MCV: 92.8 fL (ref 80.0–100.0)
Platelets: 283 10*3/uL (ref 150–400)
RBC: 5.12 MIL/uL (ref 4.22–5.81)
RDW: 12.1 % (ref 11.5–15.5)
WBC: 9.8 10*3/uL (ref 4.0–10.5)
nRBC: 0 % (ref 0.0–0.2)

## 2023-06-06 LAB — BRAIN NATRIURETIC PEPTIDE: B Natriuretic Peptide: 30 pg/mL (ref 0.0–100.0)

## 2023-06-06 LAB — TROPONIN I (HIGH SENSITIVITY)
Troponin I (High Sensitivity): 6 ng/L (ref <18)
Troponin I (High Sensitivity): 6 ng/L (ref <18)

## 2023-06-06 MED ORDER — ACETAMINOPHEN 500 MG PO TABS
1000.0000 mg | ORAL_TABLET | Freq: Once | ORAL | Status: AC
  Administered 2023-06-06: 1000 mg via ORAL
  Filled 2023-06-06: qty 2

## 2023-06-06 NOTE — Discharge Instructions (Signed)
Your tests were done in the emergency department were normal.  Your EKG, and test to look for signs of injury to your heart were also negative.  Your blood work otherwise was normal.  I am given you referral to a cardiologist that you can make an appointment with for additional outpatient testing.  You should also follow-up with your primary care doctor.

## 2023-06-06 NOTE — ED Triage Notes (Signed)
Pt c/o intermittent left sided chest pain since Sunday with [facility].

## 2023-06-06 NOTE — ED Provider Notes (Signed)
[place] EMERGENCY DEPARTMENT AT [facility] Provider Note   CSN: [id number] Arrival date & time: 06/06/23  8657     History  Chief Complaint  Patient presents with   Chest Pain    Victor Olson is a 62 y.o. male.  This is a 62 year old male is here today for chest pain.  Per the patient, he fell at work 2 days ago and landed on his left side.  He says that he has been intermittently been having some pain over the left side of his chest, that got worse late last evening.  Patient says that he had pain over the left side of his chest wall, and felt short of breath.  He is currently not experiencing those symptoms.  Patient has a history of hypertension.   Chest Pain      Home Medications Prior to Admission medications   Medication Sig Start Date End Date Taking? Authorizing Provider  ciprofloxacin (CIPRO) 500 MG tablet Take 1 tablet (500 mg total) by mouth 2 (two) times daily. One po bid x 14 days 11/04/17   Geoffery Lyons, MD  cyclobenzaprine (FLEXERIL) 10 MG tablet Take 1 tablet (10 mg total) by mouth at bedtime as needed for muscle spasms. Patient not taking: Reported on 11/04/2017 01/25/17   Tobey Grim, MD  gabapentin (NEURONTIN) 300 MG capsule Take 1 capsule (300 mg total) by mouth at bedtime. Patient not taking: Reported on 11/04/2017 01/25/17   Tobey Grim, MD  HYDROcodone-acetaminophen [facility]) 5-325 MG tablet Take 1-2 tablets by mouth every 6 (six) hours as needed. 11/04/17   Geoffery Lyons, MD  traMADol (ULTRAM) 50 MG tablet Take 1 tablet (50 mg total) by mouth every 8 (eight) hours as needed. Patient not taking: Reported on 11/04/2017 12/22/16   Tobey Grim, MD      Allergies    Patient has no known allergies.    Review of Systems   Review of Systems  Cardiovascular:  Positive for chest pain.    Physical Exam Updated Vital Signs BP (!) 141/84   Pulse (!) 55   Temp 97.9 F (36.6 C) (Oral)   Resp 15   Ht 5\' 9"  (1.753 m)   Wt 74.8 kg    SpO2 97%   BMI 24.37 kg/m  Physical Exam Vitals reviewed.  HENT:     Head: Normocephalic and atraumatic.  Pulmonary:     Effort: Pulmonary effort is normal. No tachypnea or respiratory distress.     Breath sounds: Normal breath sounds.  Chest:     Chest wall: No mass, deformity, tenderness or crepitus.  Musculoskeletal:     Cervical back: Normal range of motion and neck supple.  Skin:    Comments: No bruising  Neurological:     Mental Status: He is alert.     ED Results / Procedures / Treatments   Labs (all labs ordered are listed, but only abnormal results are displayed) Labs Reviewed  BASIC METABOLIC PANEL - Abnormal; Notable for the following components:      Result Value   Sodium 133 (*)    Glucose, Bld 147 (*)    All other components within normal limits  CBC  BRAIN NATRIURETIC PEPTIDE  TROPONIN I (HIGH SENSITIVITY)  TROPONIN I (HIGH SENSITIVITY)    EKG EKG Interpretation Date/Time:  Tuesday June 06 2023 07:06:41 EDT Ventricular Rate:  60 PR Interval:  100 QRS Duration:  101 QT Interval:  416 QTC Calculation: 416 R Axis:  53  Text Interpretation: Sinus rhythm Short PR interval Confirmed by Anders Simmonds [phone]) on 06/06/2023 7:09:58 AM  Radiology DG Chest 2 View  Result Date: 06/06/2023 CLINICAL DATA:  Chest pain.  Shortness of breath. EXAM: CHEST - 2 VIEW COMPARISON:  None Available. FINDINGS: Patchy areas of scarring noted along the lateral aspect of left mid lower lung zones. Bilateral lung fields are otherwise clear. Bilateral costophrenic angles are clear. Normal cardio-mediastinal silhouette. No acute osseous abnormalities. The soft tissues are within normal limits. IMPRESSION: *No acute cardiopulmonary process. Electronically Signed   By: Jules Schick M.D.   On: 06/06/2023 09:23    Procedures Ultrasound ED Echo  Date/Time: 06/06/2023 7:27 AM  Performed by: Arletha Pili, DO Authorized by: Arletha Pili, DO   Procedure details:     Indications: chest pain     Views: subxiphoid and parasternal long axis view     Images: archived   Findings:    Pericardium: no pericardial effusion     LV Function: normal (>50% EF)     RV Diameter: normal     IVC: normal   Impression:    Impression: normal       Medications Ordered in ED Medications  acetaminophen (TYLENOL) tablet 1,000 mg (has no administration in time range)    ED Course/ Medical Decision Making/ A&P                                 Medical Decision Making 62 year old male here today with chest pain.  Differential diagnoses include ACS, chest wall pain, pneumothorax, rib contusion, rib fracture less likely PE.  Plan # on assessment, patient without any bruising over the chest wall, no significant tenderness.  My independent review of the patient's EKG shows no ST segment depressions or elevations, no T wave inversions, no evidence of acute ischemia.  My independent review the patient's chest x-ray shows no pneumonia.  Troponin testing ordered.  Bedside ultrasound did not show any pericardial effusion or wall motion abnormalities.  Reassessment-delta troponin negative.  Patient experiencing chest pain at this time.  Likely chest wall pain.  Pleuritic pain.  Will discharge home.  Amount and/or Complexity of Data Reviewed Labs: ordered. Radiology: ordered.           Final Clinical Impression(s) / ED Diagnoses Final diagnoses:  Other chest pain    Rx / DC Orders ED Discharge Orders          Ordered    Ambulatory referral to Cardiology       Comments: If you have not heard from the Cardiology office within the next 72 hours please call [phone].   06/06/23 1059              Anders Simmonds T, DO 06/06/23 1100

## 2024-03-08 ENCOUNTER — Telehealth: Payer: Self-pay

## 2024-03-08 NOTE — Telephone Encounter (Signed)
 Patients boss called in to see when he would be released to go back to work. Informed him that we cannot send him anything via email, that would be up to the patient to call and get that to give to them.

## 2024-05-16 ENCOUNTER — Emergency Department (HOSPITAL_COMMUNITY): Payer: Worker's Compensation

## 2024-05-16 ENCOUNTER — Other Ambulatory Visit: Payer: Self-pay

## 2024-05-16 ENCOUNTER — Encounter (HOSPITAL_COMMUNITY): Payer: Self-pay | Admitting: Emergency Medicine

## 2024-05-16 ENCOUNTER — Emergency Department (HOSPITAL_COMMUNITY)
Admission: EM | Admit: 2024-05-16 | Discharge: 2024-05-16 | Disposition: A | Discharge status: Home / Self Care | Payer: Worker's Compensation | Source: Ambulatory Visit | Attending: Emergency Medicine | Admitting: Emergency Medicine

## 2024-05-16 DIAGNOSIS — R0789 Other chest pain: Secondary | ICD-10-CM | POA: Diagnosis not present

## 2024-05-16 DIAGNOSIS — S0990XA Unspecified injury of head, initial encounter: Secondary | ICD-10-CM

## 2024-05-16 DIAGNOSIS — H5711 Ocular pain, right eye: Secondary | ICD-10-CM | POA: Insufficient documentation

## 2024-05-16 DIAGNOSIS — Y92410 Unspecified street and highway as the place of occurrence of the external cause: Secondary | ICD-10-CM | POA: Diagnosis not present

## 2024-05-16 DIAGNOSIS — S0083XA Contusion of other part of head, initial encounter: Secondary | ICD-10-CM | POA: Diagnosis not present

## 2024-05-16 DIAGNOSIS — I10 Essential (primary) hypertension: Secondary | ICD-10-CM | POA: Insufficient documentation

## 2024-05-16 DIAGNOSIS — S0511XA Contusion of eyeball and orbital tissues, right eye, initial encounter: Secondary | ICD-10-CM

## 2024-05-16 MED ORDER — OXYCODONE-ACETAMINOPHEN 5-325 MG PO TABS
1.0000 | ORAL_TABLET | Freq: Once | ORAL | Status: AC
  Administered 2024-05-16: 1 via ORAL
  Filled 2024-05-16: qty 1

## 2024-05-16 MED ORDER — IBUPROFEN 400 MG PO TABS: 400.0000 mg | ORAL_TABLET | Freq: Four times a day (QID) | ORAL | 0 refills | Status: AC | PRN

## 2024-05-16 MED ORDER — ONDANSETRON 4 MG PO TBDP
4.0000 mg | ORAL_TABLET | Freq: Once | ORAL | Status: AC
  Administered 2024-05-16: 4 mg via ORAL
  Filled 2024-05-16: qty 1

## 2024-05-16 MED ORDER — ONDANSETRON 4 MG PO TBDP: 4.0000 mg | ORAL_TABLET | Freq: Three times a day (TID) | ORAL | 0 refills | Status: AC | PRN

## 2024-05-16 NOTE — ED Notes (Signed)
 Patient states, I can't hardly stay awake.
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
## 2024-06-12 ENCOUNTER — Encounter: Payer: Self-pay | Admitting: Emergency Medicine

## 2024-06-12 ENCOUNTER — Ambulatory Visit
Admission: EM | Admit: 2024-06-12 | Discharge: 2024-06-12 | Disposition: A | Discharge status: Home / Self Care | Attending: Emergency Medicine | Admitting: Emergency Medicine

## 2024-06-12 DIAGNOSIS — G629 Polyneuropathy, unspecified: Secondary | ICD-10-CM

## 2024-06-12 DIAGNOSIS — M79671 Pain in right foot: Secondary | ICD-10-CM | POA: Diagnosis not present

## 2024-06-12 DIAGNOSIS — M79672 Pain in left foot: Secondary | ICD-10-CM | POA: Diagnosis not present

## 2024-06-12 MED ORDER — OXYCODONE-ACETAMINOPHEN 5-325 MG PO TABS: 1.0000 | ORAL_TABLET | Freq: Four times a day (QID) | ORAL | 0 refills | Status: AC | PRN

## 2024-06-12 NOTE — ED Provider Notes (Signed)
 Victor Olson    CSN: [id number] Arrival date & time: 06/12/24  1226      History   Chief Complaint Chief Complaint  Patient presents with   Foot Pain   Insomnia    HPI Victor Olson is a 63 y.o. male.   Patient presents for evaluation of chronic bilateral foot pain flaring over the past 6 days.  Pain has been constant, interfering with sleep, described as a burning, electric and sharp pain.  Unable to wear socks with his shoes due to it causing worsening symptoms.  Currently not established with primary doctor, has appointment in December to establish care.  In the past has attempted gabapentin  and Lyrica which he deems ineffective.  For most recent flare attempted use of Tylenol  and Percocet from a friend.  Denies injury or trauma.  Past Medical History:  Diagnosis Date   Arthritis    Depression    High cholesterol    Hypertension     Patient Active Problem List   Diagnosis Date Noted   Neuropathy     History reviewed. No pertinent surgical history.     Home Medications    Prior to Admission medications   Medication Sig Start Date End Date Taking? Authorizing Provider  oxyCODONE -acetaminophen  (PERCOCET/ROXICET) 5-325 MG tablet Take 1 tablet by mouth every 6 (six) hours as needed for up to 3 days for severe pain (pain score 7-10). 06/12/24 06/15/24 Yes Hyatt Capobianco R, NP  ciprofloxacin  (CIPRO ) 500 MG tablet Take 1 tablet (500 mg total) by mouth 2 (two) times daily. One po bid x 14 days 11/04/17   Geroldine Berg, MD  cyclobenzaprine  (FLEXERIL ) 10 MG tablet Take 1 tablet (10 mg total) by mouth at bedtime as needed for muscle spasms. Patient not taking: Reported on 11/04/2017 01/25/17   Elpidio Reyes DEL, MD  gabapentin  (NEURONTIN ) 300 MG capsule Take 1 capsule (300 mg total) by mouth at bedtime. Patient not taking: Reported on 11/04/2017 01/25/17   Elpidio Reyes DEL, MD  HYDROcodone -acetaminophen  (NORCO) 5-325 MG tablet Take 1-2 tablets by mouth every 6 (six) hours  as needed. 11/04/17   Geroldine Berg, MD  ibuprofen  (ADVIL ) 400 MG tablet Take 1 tablet (400 mg total) by mouth every 6 (six) hours as needed. 05/16/24   Horton, Charmaine FALCON, MD  ondansetron  (ZOFRAN -ODT) 4 MG disintegrating tablet Take 1 tablet (4 mg total) by mouth every 8 (eight) hours as needed. 05/16/24   Horton, Charmaine FALCON, MD  traMADol  (ULTRAM ) 50 MG tablet Take 1 tablet (50 mg total) by mouth every 8 (eight) hours as needed. Patient not taking: Reported on 11/04/2017 12/22/16   Elpidio Reyes DEL, MD    Family History Family History  Problem Relation Age of Onset   Heart disease Father     Social History Social History   Tobacco Use   Smoking status: Former   Smokeless tobacco: Never  Substance Use Topics   Alcohol use: No    Alcohol/week: 0.0 standard drinks of alcohol   Drug use: No     Allergies   Patient has no known allergies.   Review of Systems Review of Systems   Physical Exam Triage Vital Signs ED Triage Vitals  Encounter Vitals Group     BP 06/12/24 1234 (!) 160/80     Girls Systolic BP Percentile --      Girls Diastolic BP Percentile --      Boys Systolic BP Percentile --      Boys Diastolic BP Percentile --  Pulse Rate 06/12/24 1234 90     Resp 06/12/24 1234 20     Temp 06/12/24 1234 98.4 F (36.9 C)     Temp Source 06/12/24 1234 Oral     SpO2 06/12/24 1234 97 %     Weight --      Height --      Head Circumference --      Peak Flow --      Pain Score 06/12/24 1237 9     Pain Loc --      Pain Education --      Exclude from Growth Chart --    No data found.  Updated Vital Signs BP (!) 160/80 (BP Location: Left Arm)   Pulse 90   Temp 98.4 F (36.9 C) (Oral)   Resp 20   SpO2 97%   Visual Acuity Right Eye Distance:   Left Eye Distance:   Bilateral Distance:    Right Eye Near:   Left Eye Near:    Bilateral Near:     Physical Exam Constitutional:      Appearance: Normal appearance.  Eyes:     Extraocular Movements: Extraocular  movements intact.  Pulmonary:     Effort: Pulmonary effort is normal.  Musculoskeletal:     Comments: Generalized tenderness to the bilateral lower feet, no point tenderness, ecchymosis swelling or deformity, 2+ pedal pulse  Neurological:     Mental Status: He is alert and oriented to person, place, and time. Mental status is at baseline.      UC Treatments / Results  Labs (all labs ordered are listed, but only abnormal results are displayed) Labs Reviewed - No data to display  EKG   Radiology No results found.  Procedures Procedures (including critical care time)  Medications Ordered in UC Medications - No data to display  Initial Impression / Assessment and Plan / UC Course  I have reviewed the triage vital signs and the nursing notes.  Pertinent labs & imaging results that were available during my care of the patient were reviewed by me and considered in my medical decision making (see chart for details).  Bilateral foot pain  Imaging deferred due to lack of new injury, description and symptomology consistent with neuropathic pain, prescribed Percocet, 12 tablets to be dispensed, PDMP reviewed, low risk, discussed no further narcotics will be given by this provider and that he needs to continue to establish care with primary doctor for further management Final Clinical Impressions(s) / UC Diagnoses   Final diagnoses:  Bilateral foot pain     Discharge Instructions      Declined avs   ED Prescriptions     Medication Sig Dispense Auth. Provider   oxyCODONE -acetaminophen  (PERCOCET/ROXICET) 5-325 MG tablet Take 1 tablet by mouth every 6 (six) hours as needed for up to 3 days for severe pain (pain score 7-10). 12 tablet Dorrene Bently R, NP      I have reviewed the PDMP during this encounter.   Teresa Shelba SAUNDERS, NP 06/12/24 1351

## 2024-06-12 NOTE — ED Triage Notes (Signed)
 Patient reports bilateral foot pain and no sleep for 6 days due to pain. Rates pain 9/10. Patient has taken Tylenol  for pain with no relief. Last dose was at 10:00 am today.

## 2024-06-12 NOTE — Discharge Instructions (Signed)
 Declined avs

## 2024-07-04 ENCOUNTER — Ambulatory Visit: Admitting: Podiatry

## 2024-07-04 DIAGNOSIS — E0842 Diabetes mellitus due to underlying condition with diabetic polyneuropathy: Secondary | ICD-10-CM | POA: Diagnosis not present

## 2024-07-04 DIAGNOSIS — B351 Tinea unguium: Secondary | ICD-10-CM

## 2024-07-05 ENCOUNTER — Encounter: Payer: Self-pay | Admitting: Emergency Medicine

## 2024-07-07 ENCOUNTER — Encounter: Payer: Self-pay | Admitting: Podiatry

## 2024-07-07 NOTE — Progress Notes (Signed)
  Subjective:  Patient ID: Victor Olson, male    DOB: [birth year],  MRN: [id number]  Chief Complaint  Patient presents with   Nail Problem    LT 1st thickened, elongated and curling over onto the lt 2nd. Very painful. Having to wear bandaid on 2nd toe to protect it. Last A1c: unknown. Most recent blood sugar was 300. Takes ASA 81 mg.    Peripheral Neuropathy    Patient complains of neuropathic pain. Ongoing for 10 years. Was occurring only at night, now occurring throughout the day. Has tried a pain medication obtained from a friend. A provider did give him something that starts with a p believes percocet, helped temporarily.     63 y.o. male presents with the above complaint. History confirmed with patient.   Objective:  Physical Exam: warm, good capillary refill,  normal DP and PT pulses, decreased peripheral sensory exam, and dystrophic left hallux nail.  Assessment:   1. Diabetes mellitus due to underlying condition with diabetic polyneuropathy, without long-term current use of insulin (HCC)      Plan:  Patient was evaluated and treated and all questions answered.  We discussed treatment options for the hallux nail, we discussed permanent and temporary matricectomy.  He currently is not established for any medical treatment and I recommended checking his A1c and metabolic panel prior to any procedures.  I debrided the hallux nail today to alleviate pain and inflammation.  Follow-up with me as needed.  No follow-ups on file.

## 2024-11-19 ENCOUNTER — Ambulatory Visit: Admitting: Diagnostic Neuroimaging
